# Patient Record
Sex: Female | Born: 1940 | Race: White | Hispanic: Yes | Marital: Married | Smoking: Never smoker
Health system: Southern US, Community
[De-identification: ages and names within clinical notes are randomized; demographics above are authoritative.]

## PROBLEM LIST (undated history)

## (undated) DIAGNOSIS — E119 Type 2 diabetes mellitus without complications: Secondary | ICD-10-CM

## (undated) DIAGNOSIS — I10 Essential (primary) hypertension: Secondary | ICD-10-CM

## (undated) HISTORY — PX: GALLBLADDER SURGERY: SHX652

## (undated) HISTORY — DX: Essential (primary) hypertension: I10

---

## 2004-04-08 ENCOUNTER — Ambulatory Visit (HOSPITAL_COMMUNITY): Admission: RE | Admit: 2004-04-08 | Discharge: 2004-04-09 | Payer: Self-pay | Admitting: Ophthalmology

## 2015-08-10 ENCOUNTER — Ambulatory Visit (INDEPENDENT_AMBULATORY_CARE_PROVIDER_SITE_OTHER): Payer: Medicare Other

## 2015-08-10 ENCOUNTER — Encounter (HOSPITAL_COMMUNITY): Payer: Self-pay | Admitting: *Deleted

## 2015-08-10 ENCOUNTER — Ambulatory Visit (HOSPITAL_COMMUNITY)
Admission: EM | Admit: 2015-08-10 | Discharge: 2015-08-10 | Disposition: A | Discharge status: Home / Self Care | Payer: Medicare Other | Attending: Emergency Medicine | Admitting: Emergency Medicine

## 2015-08-10 DIAGNOSIS — J40 Bronchitis, not specified as acute or chronic: Secondary | ICD-10-CM

## 2015-08-10 DIAGNOSIS — R05 Cough: Secondary | ICD-10-CM

## 2015-08-10 DIAGNOSIS — R059 Cough, unspecified: Secondary | ICD-10-CM

## 2015-08-10 DIAGNOSIS — R509 Fever, unspecified: Secondary | ICD-10-CM

## 2015-08-10 HISTORY — DX: Type 2 diabetes mellitus without complications: E11.9

## 2015-08-10 MED ORDER — PREDNISONE 5 MG PO TABS: ORAL_TABLET | ORAL | Status: DC

## 2015-08-10 MED ORDER — AZITHROMYCIN 250 MG PO TABS: 250.0000 mg | ORAL_TABLET | Freq: Every day | ORAL | Status: DC

## 2015-08-10 MED ORDER — HYDROCODONE-HOMATROPINE 5-1.5 MG/5ML PO SYRP: ORAL_SOLUTION | ORAL | Status: DC

## 2015-08-10 NOTE — Discharge Instructions (Signed)
You have bronchitis. Take all of your antibiotics as directed. Take the Prednisone as directed but keep a close eye on your sugar and dose insulin according to your schedule. The cough medication is for night time. You may take DELSYM during the day for cough as directed. Drink a lot of fluids and rest. Please f/u in the ED if you should worsen. Feel better.  Cough, Adult A cough helps to clear your throat and lungs. A cough may last only 2-3 weeks (acute), or it may last longer than 8 weeks (chronic). Many different things can cause a cough. A cough may be a sign of an illness or another medical condition. HOME CARE  Pay attention to any changes in your cough.  Take medicines only as told by your doctor.  If you were prescribed an antibiotic medicine, take it as told by your doctor. Do not stop taking it even if you start to feel better.  Talk with your doctor before you try using a cough medicine.  Drink enough fluid to keep your pee (urine) clear or pale yellow.  If the air is dry, use a cold steam vaporizer or humidifier in your home.  Stay away from things that make you cough at work or at home.  If your cough is worse at night, try using extra pillows to raise your head up higher while you sleep.  Do not smoke, and try not to be around smoke. If you need help quitting, ask your doctor.  Do not have caffeine.  Do not drink alcohol.  Rest as needed. GET HELP IF:  You have new problems (symptoms).  You cough up yellow fluid (pus).  Your cough does not get better after 2-3 weeks, or your cough gets worse.  Medicine does not help your cough and you are not sleeping well.  You have pain that gets worse or pain that is not helped with medicine.  You have a fever.  You are losing weight and you do not know why.  You have night sweats. GET HELP RIGHT AWAY IF:  You cough up blood.  You have trouble breathing.  Your heartbeat is very fast.   This information is not  intended to replace advice given to you by your health care provider. Make sure you discuss any questions you have with your health care provider.   Document Released: 12/26/2010 Document Revised: 01/03/2015 Document Reviewed: 06/21/2014 Elsevier Interactive Patient Education [employer]2016 Elsevier Inc.

## 2015-08-10 NOTE — ED Provider Notes (Signed)
CSN: [id number]     Arrival date & time 08/10/15  1334 History   First MD Initiated Contact with Patient 08/10/15 1616     Chief Complaint  Patient presents with  . Cough   (Consider location/radiation/quality/duration/timing/severity/associated sxs/prior Treatment) HPI Comments: Patient is a 75 yo Hispanic female who presents with a 5 day history of productive cough, chills, malaise and chest soreness. Subjective fevers. Reports of getting worse according to daughter who is present and interpreter.   The history is provided by the patient.    Past Medical History  Diagnosis Date  . Diabetes mellitus without complication (HCC)    History reviewed. No pertinent past surgical history. No family history on file. Social History  Substance Use Topics  . Smoking status: Never Smoker   . Smokeless tobacco: None  . Alcohol Use: Yes   OB History    No data available     Review of Systems  Constitutional: Positive for fever, chills and fatigue.  HENT: Negative.   Respiratory: Positive for cough. Negative for shortness of breath, wheezing and stridor.   Cardiovascular: Positive for chest pain.  Musculoskeletal: Negative.   Skin: Negative.   Neurological: Negative.     Allergies  Review of patient's allergies indicates no known allergies.  Home Medications   Prior to Admission medications   Medication Sig Start Date End Date Taking? Authorizing Provider  azithromycin (ZITHROMAX) 250 MG tablet Take 1 tablet (250 mg total) by mouth daily. Take first 2 tablets together, then 1 every day until finished. 08/10/15   Riki SheerMichelle G Young, PA-C  HYDROcodone-homatropine [facility](HYCODAN) 5-1.5 MG/5ML syrup 5ml at night for cough 08/10/15   Riki SheerMichelle G Young, PA-C  predniSONE (DELTASONE) 5 MG tablet 4 tabs po x 3 days, then 3 tabs po x 3 days, then 2 tabs po x 3 days then 1 tab po x 3 days then stop 08/10/15   Riki SheerMichelle G Young, PA-C   Meds Ordered and Administered this Visit  Medications - No data to  display  BP 122/76 mmHg  Pulse 88  Temp(Src) 99.8 F (37.7 C) (Oral)  Resp 16  SpO2 97% No data found.   Physical Exam  Constitutional: She is oriented to person, place, and time. She appears well-developed and well-nourished. No distress.  Neck: Normal range of motion.  Pulmonary/Chest: Effort normal.  Left basilar crackles, moving air without wheeze, no rales  Lymphadenopathy:    She has no cervical adenopathy.  Neurological: She is alert and oriented to person, place, and time.  Skin: Skin is warm and dry. No rash noted. She is not diaphoretic.  Psychiatric: Her behavior is normal.  Nursing note and vitals reviewed.   ED Course  Procedures (including critical care time)  Labs Review Labs Reviewed - No data to display  Imaging Review Dg Chest 2 View  08/10/2015  CLINICAL DATA:  Cough and low-grade fever for the past 5 days. EXAM: CHEST  2 VIEW COMPARISON:  04/08/2004. FINDINGS: Normal sized heart. Clear lungs with normal vascularity. Minimal central peribronchial thickening. Mild thoracic spine degenerative changes. IMPRESSION: Minimal bronchitic changes. Electronically Signed   By: Beckie SaltsSteven  Reid M.D.   On: 08/10/2015 17:14     Visual Acuity Review  Right Eye Distance:   Left Eye Distance:   Bilateral Distance:    Right Eye Near:   Left Eye Near:    Bilateral Near:         MDM   1. Bronchitis   2. Cough  3. Other specified fever    1. Due to age and co-morbidities cover with antibiotic. Treat with Prednisone and symptomatic care. Fluids and rest. If worsens please go to the ER for further evaluation.     Riki Sheer, PA-C 08/10/15 1731  Riki Sheer, PA-C 08/10/15 [phone]

## 2015-08-10 NOTE — ED Notes (Signed)
Pt  Has  An interpretor   At bedside

## 2015-08-10 NOTE — ED Notes (Signed)
Pt  Reports    Symptoms  Of          Cough   Congestion  With  Some  jitteryness       With  Onset  Of  Symptoms       For  Several days         Pt  Is  A  Diabetic        Took  Her  Insulin today  But  disd  Not  Check  Her   Sugar       She  Reports  The  Symptoms  Began  About 54-5  Days  Ago

## 2015-10-11 ENCOUNTER — Telehealth: Payer: Self-pay | Admitting: Cardiovascular Disease

## 2015-10-11 NOTE — Telephone Encounter (Signed)
[facility] 10/12/15 with Dr Allyson SabalBerry.  [facility] Dr Hazle CocaBerry's schedule on 10/12/15. lp

## 2015-10-12 ENCOUNTER — Other Ambulatory Visit: Payer: Self-pay

## 2015-10-12 ENCOUNTER — Ambulatory Visit (INDEPENDENT_AMBULATORY_CARE_PROVIDER_SITE_OTHER): Payer: Medicare Other | Admitting: Cardiovascular Disease

## 2015-10-12 ENCOUNTER — Encounter: Payer: Self-pay | Admitting: Cardiovascular Disease

## 2015-10-12 VITALS — BP 146/83 | HR 92 | Ht 62.0 in | Wt 148.6 lb

## 2015-10-12 DIAGNOSIS — R931 Abnormal findings on diagnostic imaging of heart and coronary circulation: Secondary | ICD-10-CM | POA: Diagnosis not present

## 2015-10-12 DIAGNOSIS — E785 Hyperlipidemia, unspecified: Secondary | ICD-10-CM | POA: Insufficient documentation

## 2015-10-12 DIAGNOSIS — I10 Essential (primary) hypertension: Secondary | ICD-10-CM | POA: Insufficient documentation

## 2015-10-12 DIAGNOSIS — R079 Chest pain, unspecified: Secondary | ICD-10-CM | POA: Diagnosis not present

## 2015-10-12 DIAGNOSIS — R9439 Abnormal result of other cardiovascular function study: Secondary | ICD-10-CM

## 2015-10-12 LAB — CBC WITH DIFFERENTIAL/PLATELET
Basophils Absolute: 66 cells/uL (ref 0–200)
Basophils Relative: 1 %
Eosinophils Absolute: 132 cells/uL (ref 15–500)
Eosinophils Relative: 2 %
HCT: 39.4 % (ref 35.0–45.0)
Hemoglobin: 12.7 g/dL (ref 11.7–15.5)
LYMPHS ABS: 1848 {cells}/uL (ref 850–3900)
Lymphocytes Relative: 28 %
MCH: 29.1 pg (ref 27.0–33.0)
MCHC: 32.2 g/dL (ref 32.0–36.0)
MCV: 90.2 fL (ref 80.0–100.0)
MPV: 11.1 fL (ref 7.5–12.5)
Monocytes Absolute: 660 cells/uL (ref 200–950)
Monocytes Relative: 10 %
Neutro Abs: 3894 cells/uL (ref 1500–7800)
Neutrophils Relative %: 59 %
Platelets: 322 10*3/uL (ref 140–400)
RBC: 4.37 MIL/uL (ref 3.80–5.10)
RDW: 12.3 % (ref 11.0–15.0)
WBC: 6.6 10*3/uL (ref 3.8–10.8)

## 2015-10-12 LAB — BASIC METABOLIC PANEL
BUN: 14 mg/dL (ref 7–25)
CO2: 27 mmol/L (ref 20–31)
Calcium: 8.5 mg/dL — ABNORMAL LOW (ref 8.6–10.4)
Chloride: 99 mmol/L (ref 98–110)
Creat: 0.76 mg/dL (ref 0.60–0.93)
Glucose, Bld: 430 mg/dL — ABNORMAL HIGH (ref 65–99)
Potassium: 5.1 mmol/L (ref 3.5–5.3)
SODIUM: 135 mmol/L (ref 135–146)

## 2015-10-12 LAB — TSH: TSH: 2.76 mIU/L

## 2015-10-12 NOTE — Progress Notes (Signed)
10/12/2015 Laurance FlattenYsidra Gubbels   1940-07-11  [id number]  Primary Physician No PCP Per Patient Primary Cardiologist: Runell GessJonathan J Reyan Helle MD Roseanne RenoFACP, FACC, FAHA, FSCAI  HPI:  Ms Sherry Ingram is a 75 year old mildly overweight married Latino female mother of 11 children referred by Dr. Hanley Haysosario for cardiac catheterization. She has a history of hypertension and insulin-dependent diabetes. She currently had a recent 2-D echo performed on June 6 that showed an EF of 35% with septal and anteroapical wall motion abnormality with an apical mural thrombus and subsequent pharmacologic Myoview stress test that showed severe apical mural scar. She was referred for cardiac catheterization to rule out significant CAD.   Current Outpatient Prescriptions  Medication Sig Dispense Refill  . azithromycin (ZITHROMAX) 250 MG tablet Take 1 tablet (250 mg total) by mouth daily. Take first 2 tablets together, then 1 every day until finished. 6 tablet 0  . HYDROcodone-homatropine (HYCODAN) 5-1.5 MG/5ML syrup 5ml at night for cough 120 mL 0  . predniSONE (DELTASONE) 5 MG tablet 4 tabs po x 3 days, then 3 tabs po x 3 days, then 2 tabs po x 3 days then 1 tab po x 3 days then stop 30 tablet 0   No current facility-administered medications for this visit.    No Known Allergies  Social History   Social History  . Marital Status: Married    Spouse Name: N/A  . Number of Children: N/A  . Years of Education: N/A   Occupational History  . Not on file.   Social History Main Topics  . Smoking status: Never Smoker   . Smokeless tobacco: Not on file  . Alcohol Use: Yes  . Drug Use: Not on file  . Sexual Activity: Not on file   Other Topics Concern  . Not on file   Social History Narrative     Review of Systems: General: negative for chills, fever, night sweats or weight changes.  Cardiovascular: negative for chest pain, dyspnea on exertion, edema, orthopnea, palpitations, paroxysmal nocturnal dyspnea or shortness of  breath Dermatological: negative for rash Respiratory: negative for cough or wheezing Urologic: negative for hematuria Abdominal: negative for nausea, vomiting, diarrhea, bright red blood per rectum, melena, or hematemesis Neurologic: negative for visual changes, syncope, or dizziness All other systems reviewed and are otherwise negative except as noted above.    Blood pressure 146/83, pulse 92, height 5\' 2"  (1.575 m), weight 148 lb 9.6 oz (67.405 kg).  General appearance: alert and no distress Neck: no adenopathy, no carotid bruit, no JVD, supple, symmetrical, trachea midline and thyroid not enlarged, symmetric, no tenderness/mass/nodules Lungs: clear to auscultation bilaterally Heart: regular rate and rhythm, S1, S2 normal, no murmur, click, rub or gallop Extremities: extremities normal, atraumatic, no cyanosis or edema  EKG normal sinus rhythm at 92 with Q waves in V1 through V4 and left axis deviation with low limb voltage. I personally reviewed this EKG  ASSESSMENT AND PLAN:   Abnormal nuclear stress test Ms Sherry Ingram it is a 75 year old mildly overweight married Latino female with very by Dr. Hanley Haysosario for an abnormal Myoview stress test for cardiac catheterization. 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 Dr. Hanley Haysosario wanted a left heart cath performed to rule out multivessel disease.  I have reviewed the risks, indications, and alternatives to cardiac catheterization, possible  angioplasty, and stenting with the patient. Risks include but are not limited to bleeding, infection, vascular injury, stroke, myocardial infection, arrhythmia, kidney injury, radiation-related injury in the case of prolonged fluoroscopy use, emergency cardiac surgery, and death. The patient understands the  risks of serious complication is 1-2 in 1000 with diagnostic cardiac cath and 1-2% or less with angioplasty/stenting.       Rishaan Gunner J. [place] Ndiaye MD FACP,FACC,FAHA, FSCAI 10/12/2015 11:23 AM 

## 2015-10-12 NOTE — Telephone Encounter (Signed)
This encounter was created in error - please disregard.

## 2015-10-12 NOTE — Assessment & Plan Note (Signed)
Ms Sherry Ingram it is a 75 year old mildly overweight married Latino female with very by Dr. Hanley Haysosario for an abnormal Myoview stress test for cardiac catheterization. 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 Dr. Hanley Haysosario wanted a left heart cath performed to rule out multivessel disease.  I have reviewed the risks, indications, and alternatives to cardiac catheterization, possible angioplasty, and stenting with the patient. Risks include but are not limited to bleeding, infection, vascular injury, stroke, myocardial infection, arrhythmia, kidney injury, radiation-related injury in the case of prolonged fluoroscopy use, emergency cardiac surgery, and death. The patient understands the risks of serious complication is 1-2 in 1000 with diagnostic cardiac cath and 1-2% or less with angioplasty/stenting.

## 2015-10-12 NOTE — Patient Instructions (Signed)
Medication Instructions:  Your physician recommends that you continue on your current medications as directed. Please refer to the Current Medication list given to you today.   Labwork: Your physician recommends that you return for lab work in: Today   Testing/Procedures: Your physician has requested that you have a cardiac catheterization. Cardiac catheterization is used to diagnose and/or treat various heart conditions. Doctors may recommend this procedure for a number of different reasons. The most common reason is to evaluate chest pain. Chest pain can be a symptom of coronary artery disease (CAD), and cardiac catheterization can show whether plaque is narrowing or blocking your heart's arteries. This procedure is also used to evaluate the valves, as well as measure the blood flow and oxygen levels in different parts of your heart. For further information please visit [email or web address]. Monday, 10/22/15  Following your catheterization, you will not be allowed to drive for 3 days.  No lifting, pushing, or pulling greater that 10 pounds is allowed for 1 week.  You will be required to have the following tests prior to the procedure:  1. Blood work-the blood work can be done no more than 7 days prior to the procedure.  [facility].  There is one downstairs on the first floor of this building and one in the [facility] building [phone]([address], [address]).   Puncture site Right Groin   Any Other Special Instructions Will Be Listed Below (If Applicable).     If you need a refill on your cardiac medications before your next appointment, please call your pharmacy.

## 2015-10-13 LAB — PROTIME-INR
INR: 1
Prothrombin Time: 10.8 s (ref 9.0–11.5)

## 2015-10-13 LAB — APTT: aPTT: 27 s (ref 22–34)

## 2015-10-19 ENCOUNTER — Telehealth: Payer: Self-pay | Admitting: *Deleted

## 2015-10-19 NOTE — Telephone Encounter (Signed)
Smokey from the cath lab spoke with patient regarding change in arrival time and procedure

## 2015-10-22 ENCOUNTER — Ambulatory Visit (HOSPITAL_COMMUNITY)
Admission: RE | Admit: 2015-10-22 | Discharge: 2015-10-22 | Disposition: A | Discharge status: Home / Self Care | Payer: Medicare Other | Source: Ambulatory Visit | Attending: Cardiovascular Disease | Admitting: Cardiovascular Disease

## 2015-10-22 ENCOUNTER — Encounter (HOSPITAL_COMMUNITY)
Admission: RE | Disposition: A | Discharge status: Home / Self Care | Payer: Self-pay | Source: Ambulatory Visit | Attending: Cardiovascular Disease

## 2015-10-22 DIAGNOSIS — Z794 Long term (current) use of insulin: Secondary | ICD-10-CM | POA: Diagnosis not present

## 2015-10-22 DIAGNOSIS — E119 Type 2 diabetes mellitus without complications: Secondary | ICD-10-CM | POA: Insufficient documentation

## 2015-10-22 DIAGNOSIS — I10 Essential (primary) hypertension: Secondary | ICD-10-CM | POA: Diagnosis not present

## 2015-10-22 DIAGNOSIS — Z6828 Body mass index (BMI) 28.0-28.9, adult: Secondary | ICD-10-CM | POA: Diagnosis not present

## 2015-10-22 DIAGNOSIS — R9439 Abnormal result of other cardiovascular function study: Secondary | ICD-10-CM | POA: Diagnosis present

## 2015-10-22 DIAGNOSIS — I2510 Atherosclerotic heart disease of native coronary artery without angina pectoris: Secondary | ICD-10-CM

## 2015-10-22 DIAGNOSIS — E663 Overweight: Secondary | ICD-10-CM | POA: Diagnosis not present

## 2015-10-22 HISTORY — PX: CARDIAC CATHETERIZATION: SHX172

## 2015-10-22 SURGERY — LEFT HEART CATH AND CORONARY ANGIOGRAPHY
Anesthesia: LOCAL

## 2015-10-22 MED ORDER — MORPHINE SULFATE (PF) 2 MG/ML IV SOLN: 2.0000 mg | INTRAVENOUS | Status: DC | PRN

## 2015-10-22 MED ORDER — SODIUM CHLORIDE 0.9 % WEIGHT BASED INFUSION: 1.0000 mL/kg/h | INTRAVENOUS | Status: DC

## 2015-10-22 MED ORDER — SODIUM CHLORIDE 0.9 % IV SOLN: 250.0000 mL | INTRAVENOUS | Status: DC | PRN

## 2015-10-22 MED ORDER — SODIUM CHLORIDE 0.9% FLUSH: 3.0000 mL | INTRAVENOUS | Status: DC | PRN

## 2015-10-22 MED ORDER — ACETAMINOPHEN 325 MG PO TABS: 650.0000 mg | ORAL_TABLET | ORAL | Status: DC | PRN

## 2015-10-22 MED ORDER — SODIUM CHLORIDE 0.9% FLUSH: 3.0000 mL | Freq: Two times a day (BID) | INTRAVENOUS | Status: DC

## 2015-10-22 MED ORDER — ASPIRIN 81 MG PO CHEW
81.0000 mg | CHEWABLE_TABLET | ORAL | Status: AC
  Administered 2015-10-22: 81 mg via ORAL

## 2015-10-22 MED ORDER — HEPARIN (PORCINE) IN NACL 2-0.9 UNIT/ML-% IJ SOLN
INTRAMUSCULAR | Status: DC | PRN
  Administered 2015-10-22: 1000 mL

## 2015-10-22 MED ORDER — LIDOCAINE HCL (PF) 1 % IJ SOLN
INTRAMUSCULAR | Status: DC | PRN
  Administered 2015-10-22: 25 mL

## 2015-10-22 MED ORDER — IOPAMIDOL (ISOVUE-370) INJECTION 76%
INTRAVENOUS | Status: DC | PRN
  Administered 2015-10-22: 50 mL via INTRA_ARTERIAL

## 2015-10-22 MED ORDER — ONDANSETRON HCL 4 MG/2ML IJ SOLN: 4.0000 mg | Freq: Four times a day (QID) | INTRAMUSCULAR | Status: DC | PRN

## 2015-10-22 MED ORDER — LIDOCAINE HCL (PF) 1 % IJ SOLN
INTRAMUSCULAR | Status: AC
  Filled 2015-10-22: qty 30

## 2015-10-22 MED ORDER — ASPIRIN 81 MG PO CHEW
CHEWABLE_TABLET | ORAL | Status: AC
  Filled 2015-10-22: qty 1

## 2015-10-22 MED ORDER — HEPARIN (PORCINE) IN NACL 2-0.9 UNIT/ML-% IJ SOLN
INTRAMUSCULAR | Status: AC
  Filled 2015-10-22: qty 1000

## 2015-10-22 MED ORDER — IOPAMIDOL (ISOVUE-370) INJECTION 76%
INTRAVENOUS | Status: AC
  Filled 2015-10-22: qty 100

## 2015-10-22 MED ORDER — SODIUM CHLORIDE 0.9 % WEIGHT BASED INFUSION
3.0000 mL/kg/h | INTRAVENOUS | Status: DC
  Administered 2015-10-22: 3 mL/kg/h via INTRAVENOUS

## 2015-10-22 SURGICAL SUPPLY — 7 items
CATH INFINITI 5FR MULTPACK ANG (CATHETERS) ×1 IMPLANT
DEVICE CLOSURE MYNXGRIP 5F (Vascular Products) ×1 IMPLANT
KIT HEART LEFT (KITS) ×2 IMPLANT
PACK CARDIAC CATHETERIZATION (CUSTOM PROCEDURE TRAY) ×2 IMPLANT
SHEATH PINNACLE 5F 10CM (SHEATH) ×1 IMPLANT
TRANSDUCER W/STOPCOCK (MISCELLANEOUS) ×2 IMPLANT
WIRE EMERALD 3MM-J .035X150CM (WIRE) ×1 IMPLANT

## 2015-10-22 NOTE — Interval H&P Note (Signed)
Cath Lab Visit (complete for each Cath Lab visit)  Clinical Evaluation Leading to the Procedure:   ACS: No.  Non-ACS:    Anginal Classification: CCS I  Anti-ischemic medical therapy: No Therapy  Non-Invasive Test Results: Intermediate-risk stress test findings: cardiac mortality 1-3%/year  Prior CABG: No previous CABG      History and Physical Interval Note:  10/22/2015 2:31 PM  Sherry Ingram  has presented today for surgery, with the diagnosis of Abnromal Stress Test/CP  The various methods of treatment have been discussed with the patient and family. After consideration of risks, benefits and other options for treatment, the patient has consented to  Procedure(s): Left Heart Cath and Coronary Angiography (N/A) as a surgical intervention .  The patient's history has been reviewed, patient examined, no change in status, stable for surgery.  I have reviewed the patient's chart and labs.  Questions were answered to the patient's satisfaction.     Nanetta BattyBerry, Jonathan

## 2015-10-22 NOTE — H&P (View-Only) (Signed)
10/12/2015 Sherry Ingram   1940-07-11  [id number]  Primary Physician No PCP Per Patient Primary Cardiologist: Runell GessJonathan J Makaela Cando MD Roseanne RenoFACP, FACC, FAHA, FSCAI  HPI:  Sherry Ingram is a 75 year old mildly overweight married Latino female mother of 11 children referred by Dr. Hanley Haysosario for cardiac catheterization. She has a history of hypertension and insulin-dependent diabetes. She currently had a recent 2-D echo performed on June 6 that showed an EF of 35% with septal and anteroapical wall motion abnormality with an apical mural thrombus and subsequent pharmacologic Myoview stress test that showed severe apical mural scar. She was referred for cardiac catheterization to rule out significant CAD.   Current Outpatient Prescriptions  Medication Sig Dispense Refill  . azithromycin (ZITHROMAX) 250 MG tablet Take 1 tablet (250 mg total) by mouth daily. Take first 2 tablets together, then 1 every day until finished. 6 tablet 0  . HYDROcodone-homatropine (HYCODAN) 5-1.5 MG/5ML syrup 5ml at night for cough 120 mL 0  . predniSONE (DELTASONE) 5 MG tablet 4 tabs po x 3 days, then 3 tabs po x 3 days, then 2 tabs po x 3 days then 1 tab po x 3 days then stop 30 tablet 0   No current facility-administered medications for this visit.    No Known Allergies  Social History   Social History  . Marital Status: Married    Spouse Name: N/A  . Number of Children: N/A  . Years of Education: N/A   Occupational History  . Not on file.   Social History Main Topics  . Smoking status: Never Smoker   . Smokeless tobacco: Not on file  . Alcohol Use: Yes  . Drug Use: Not on file  . Sexual Activity: Not on file   Other Topics Concern  . Not on file   Social History Narrative     Review of Systems: General: negative for chills, fever, night sweats or weight changes.  Cardiovascular: negative for chest pain, dyspnea on exertion, edema, orthopnea, palpitations, paroxysmal nocturnal dyspnea or shortness of  breath Dermatological: negative for rash Respiratory: negative for cough or wheezing Urologic: negative for hematuria Abdominal: negative for nausea, vomiting, diarrhea, bright red blood per rectum, melena, or hematemesis Neurologic: negative for visual changes, syncope, or dizziness All other systems reviewed and are otherwise negative except as noted above.    Blood pressure 146/83, pulse 92, height 5\' 2"  (1.575 m), weight 148 lb 9.6 oz (67.405 kg).  General appearance: alert and no distress Neck: no adenopathy, no carotid bruit, no JVD, supple, symmetrical, trachea midline and thyroid not enlarged, symmetric, no tenderness/mass/nodules Lungs: clear to auscultation bilaterally Heart: regular rate and rhythm, S1, S2 normal, no murmur, click, rub or gallop Extremities: extremities normal, atraumatic, no cyanosis or edema  EKG normal sinus rhythm at 92 with Q waves in V1 through V4 and left axis deviation with low limb voltage. I personally reviewed this EKG  ASSESSMENT AND PLAN:   Abnormal nuclear stress test Sherry Ingram it is a 75 year old mildly overweight married Latino female with very by Dr. Hanley Haysosario for an abnormal Myoview stress test for cardiac catheterization. 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 Dr. Hanley Haysosario wanted a left heart cath performed to rule out multivessel disease.  I have reviewed the risks, indications, and alternatives to cardiac catheterization, possible  angioplasty, and stenting with the patient. Risks include but are not limited to bleeding, infection, vascular injury, stroke, myocardial infection, arrhythmia, kidney injury, radiation-related injury in the case of prolonged fluoroscopy use, emergency cardiac surgery, and death. The patient understands the  risks of serious complication is 1-2 in 1000 with diagnostic cardiac cath and 1-2% or less with angioplasty/stenting.       Runell GessJonathan J. Vlad Mayberry MD FACP,FACC,FAHA, [facility] 10/12/2015 11:23 AM

## 2015-10-22 NOTE — Discharge Instructions (Signed)

## 2015-10-23 ENCOUNTER — Encounter (HOSPITAL_COMMUNITY): Payer: Self-pay | Admitting: Cardiovascular Disease

## 2015-10-24 ENCOUNTER — Telehealth: Payer: Self-pay | Admitting: Cardiovascular Disease

## 2015-10-24 NOTE — Telephone Encounter (Signed)
Daughter is here from [place] taking care of pt while she is sick.The daughter will need a note for work. Please call,she will give you the details on what she needs.Please call asap,she needs this note to stay longer. She was supposed to leave today.

## 2015-10-24 NOTE — Telephone Encounter (Signed)
Returned call to patient's daughter Byrd HesselbachMaria.She stated she came to [place] to help take care of her mother who recently had a cardiac cath.Stated she needed a letter to be out of work 10/19/15 to 11/02/15.Letter faxed to [employer] at fax # [phone].

## 2015-11-21 ENCOUNTER — Ambulatory Visit: Payer: Medicare Other | Admitting: Nurse Practitioner

## 2015-12-14 ENCOUNTER — Encounter: Payer: Self-pay | Admitting: *Deleted

## 2015-12-14 ENCOUNTER — Ambulatory Visit: Payer: Medicare Other | Admitting: Cardiovascular Disease

## 2015-12-14 NOTE — Progress Notes (Signed)
Letter mailed

## 2017-06-18 ENCOUNTER — Encounter: Payer: Self-pay | Admitting: Family Medicine

## 2017-12-19 IMAGING — DX DG CHEST 2V
2 series · 2 of 2 positions shown · non-contrast
Comparison: 04/08/2004.

CLINICAL DATA: Cough and low-grade fever for the past 5 days.

EXAM:
CHEST  2 VIEW

[chest pa]
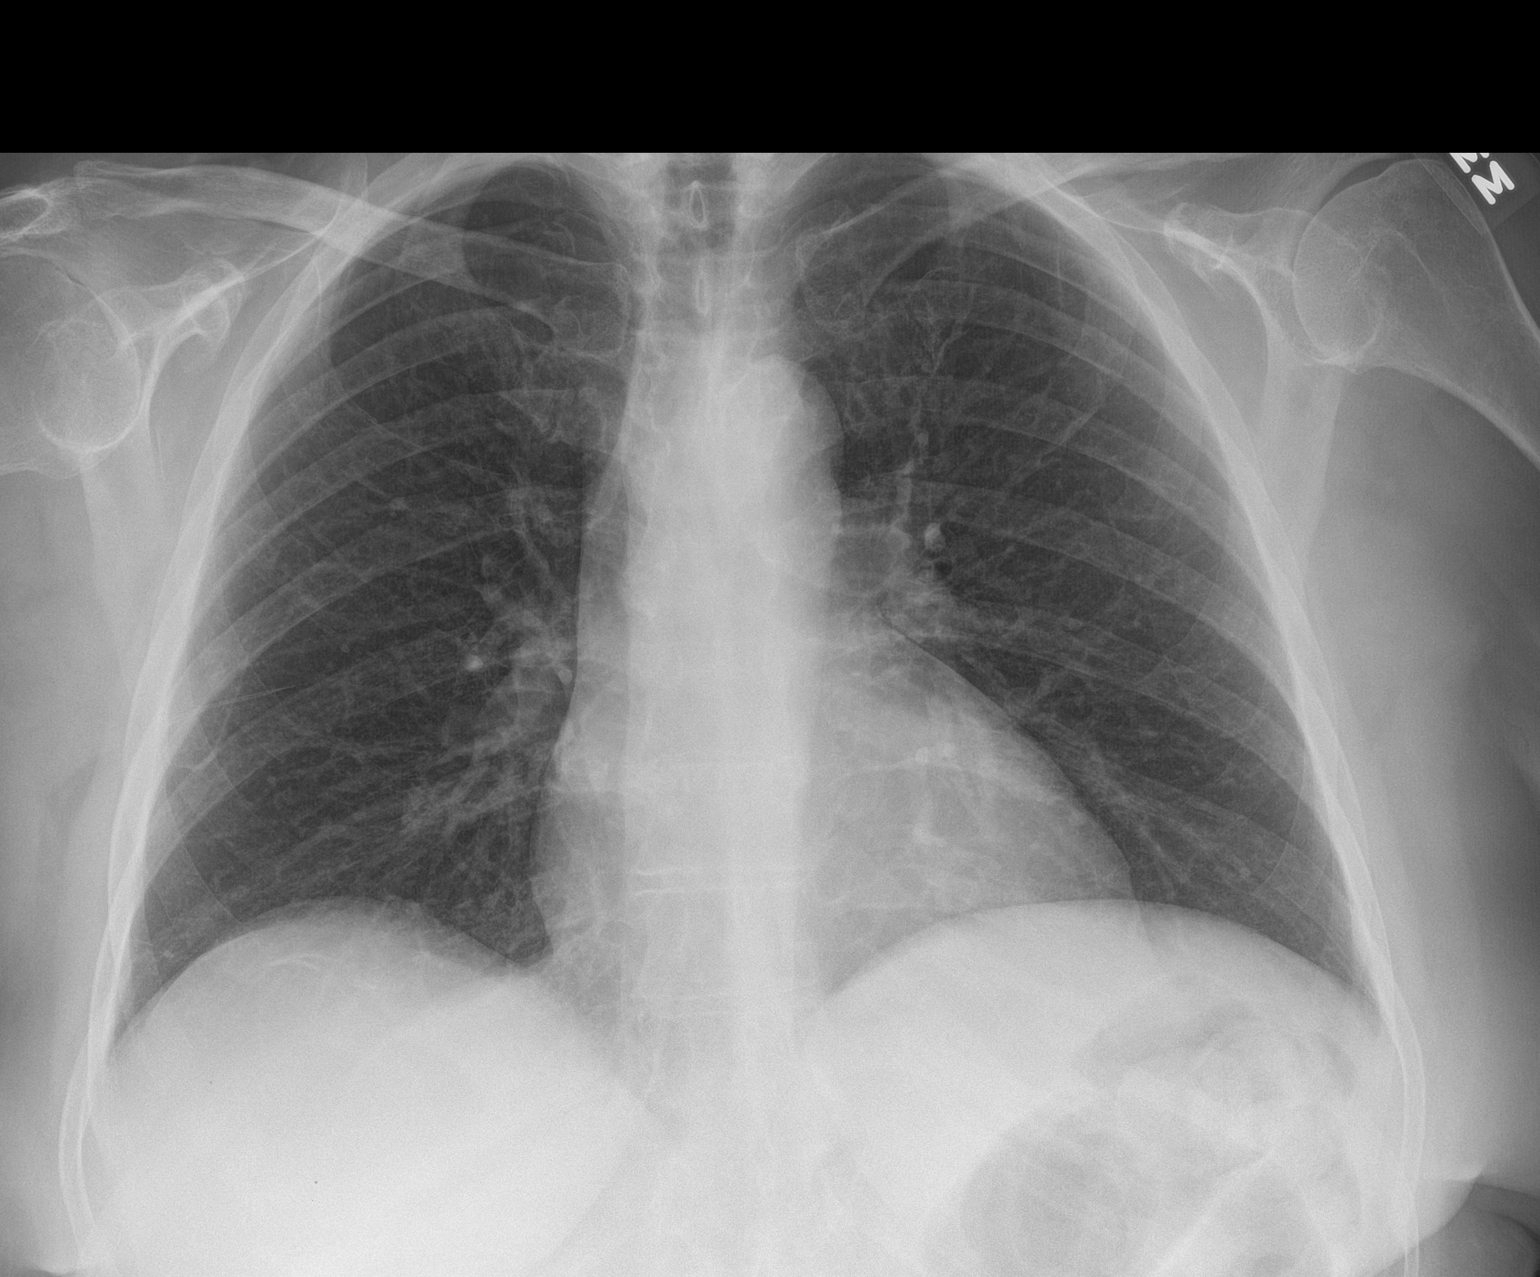

[chest lat]
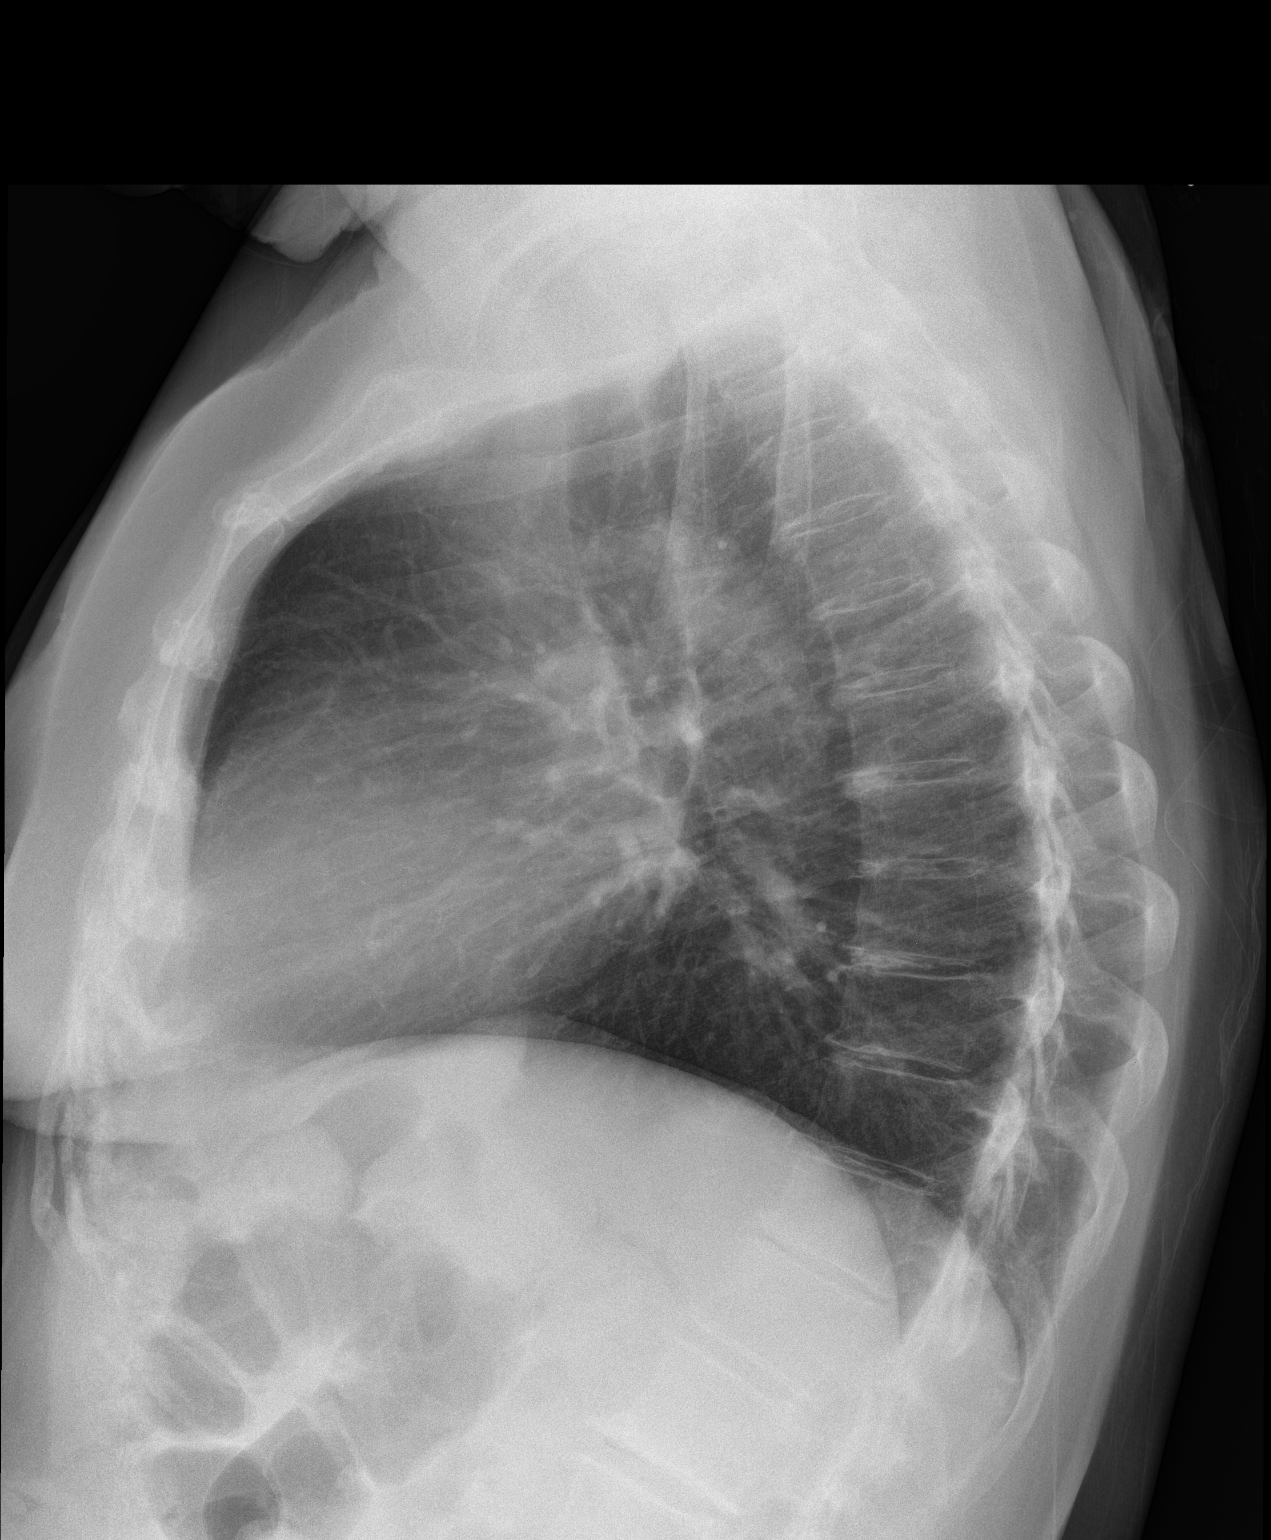

[2 of 2 positions shown; findings below may reference images not displayed]

FINDINGS: Normal sized heart. Clear lungs with normal vascularity. Minimal
central peribronchial thickening. Mild thoracic spine degenerative
changes.
IMPRESSION: Minimal bronchitic changes.

## 2018-03-03 ENCOUNTER — Ambulatory Visit: Payer: Medicare Other | Admitting: Obstetrics and Gynecology

## 2018-03-03 ENCOUNTER — Encounter: Payer: Self-pay | Admitting: *Deleted

## 2018-03-03 ENCOUNTER — Encounter: Payer: Self-pay | Admitting: Obstetrics and Gynecology

## 2018-03-03 DIAGNOSIS — R339 Retention of urine, unspecified: Secondary | ICD-10-CM

## 2018-03-03 DIAGNOSIS — R319 Hematuria, unspecified: Secondary | ICD-10-CM | POA: Insufficient documentation

## 2018-03-03 NOTE — Progress Notes (Signed)
Pt has been having light bleeding  For 4 months straight then it stopped for a week and restarted and it was heavier. She went to the hospital for the bleeding and gave her an injection and it stopped for awhile but it restarted.She hasn't been bleeding today, is having dizziness & eyesight is getting worse.

## 2018-03-04 NOTE — Progress Notes (Signed)
Patient ID: Sherry Ingram, female   DOB: June 10, 1940, 77 y.o.   MRN: [id number] Pt presents with husband with c/o hematuria and pain. This has been a problems for several months. Review of medical records that pt brought, reveal that she has been seen by private OB/GYN for this problem. She has had a GYN U/S and pap smear. She has also been seen in ER for the same problem. She has had a CT scan and has been referred to urology for further evaluation.  Pt and husband were informed that she needs to keep her appt with the urology has scheduled for further evaluation of her problem.   PE AF VSS Lungs  Heart RRR  A/P Hematuria  As noted above. F/U PRN Interrupter was used during this office.

## 2019-06-16 ENCOUNTER — Encounter: Payer: Self-pay | Admitting: Physician Assistant

## 2020-03-02 ENCOUNTER — Encounter: Payer: Self-pay | Admitting: Cardiovascular Disease

## 2020-03-02 ENCOUNTER — Ambulatory Visit (INDEPENDENT_AMBULATORY_CARE_PROVIDER_SITE_OTHER): Payer: Medicare Other | Admitting: Cardiovascular Disease

## 2020-03-02 ENCOUNTER — Other Ambulatory Visit: Payer: Self-pay

## 2020-03-02 VITALS — BP 108/68 | HR 69 | Ht 60.0 in | Wt 142.0 lb

## 2020-03-02 DIAGNOSIS — I10 Essential (primary) hypertension: Secondary | ICD-10-CM | POA: Diagnosis not present

## 2020-03-02 DIAGNOSIS — E785 Hyperlipidemia, unspecified: Secondary | ICD-10-CM | POA: Diagnosis not present

## 2020-03-02 DIAGNOSIS — I2510 Atherosclerotic heart disease of native coronary artery without angina pectoris: Secondary | ICD-10-CM | POA: Diagnosis not present

## 2020-03-02 NOTE — Assessment & Plan Note (Signed)
History of essential hypertension a blood pressure measured today 108/68.  She is on carvedilol, losartan and hydrochlorothiazide.

## 2020-03-02 NOTE — Patient Instructions (Signed)
Medication Instructions:  Your physician recommends that you continue on your current medications as directed. Please refer to the Current Medication list given to you today.  *If you need a refill on your cardiac medications before your next appointment, please call your pharmacy*  Follow-Up: At [facility], you and your health needs are our priority.  As part of our continuing mission to provide you with exceptional heart care, we have created designated Provider Care Teams.  These Care Teams include your primary Cardiologist (physician) and Advanced Practice Providers (APPs -  Physician Assistants and Nurse Practitioners) who all work together to provide you with the care you need, when you need it.  We recommend signing up for the patient portal called "MyChart".  Sign up information is provided on this After Visit Summary.  MyChart is used to connect with patients for Virtual Visits (Telemedicine).  Patients are able to view lab/test results, encounter notes, upcoming appointments, etc.  Non-urgent messages can be sent to your provider as well.   To learn more about what you can do with MyChart, go to [email or web address].    Your next appointment:    You may see Dr. Erlene Quan when needed.

## 2020-03-02 NOTE — Assessment & Plan Note (Signed)
History of CAD with moderate LV dysfunction and anteroapical scar on Myoview stress test performed 06/18/2017.  I did perform cardiac catheterization on her femoral E6/26/17 revealing a recanalized LAD in the midportion after second diagonal branch with TIMI-3 flow.  I did not think this required intervention given the fact that it did not seem she had viable myocardium beyond this and she has been asymptomatic otherwise except for some mild chest pain in the last several weeks at which has resolved spontaneously.

## 2020-03-02 NOTE — Progress Notes (Signed)
03/02/2020 Sherry Ingram   04-16-1941  [id number]  Primary Physician Patient, No Pcp Per Primary Cardiologist: Runell Gess MD Sherry Ingram, [place]  HPI:  Sherry Ingram is a 79 y.o.  mildly overweight married Latino female mother of 11 children referred by Dr. Hanley Hays for cardiac catheterization. She has a history of hypertension and insulin-dependent diabetes. She currently had a recent 2-D echo performed on June 6 that showed an EF of 35% with septal and anteroapical wall motion abnormality with an apical mural thrombus and subsequent pharmacologic Myoview stress test that showed severe apical mural scar. She was referred for cardiac catheterization to rule out significant CAD.  I performed left heart cath via the right femoral approach 10/22/2015 revealing a recanalized LAD in the midportion of the second diagonal branch.  She had no other significant CAD.  Based on the fact that her Myoview showed anteroapical scar (nonviable myocardium) I elected not to intervene.  She returns today to "check out her heart".  She did have some chest pain several weeks ago but none since and is otherwise been stable.    Current Meds  Medication Sig  . aspirin EC 81 MG tablet Take 81 mg by mouth daily.  . carvedilol (COREG) 3.125 MG tablet TOME UNA TABLETA DOS VECES AL D?A  . ENTRESTO 49-51 MG TOME UNA TABLETA DOS VECES AL D?A  . insulin glargine (LANTUS) 100 UNIT/ML injection Inject 30 Units into the skin every morning.  Marland Kitchen JANUVIA 100 MG tablet TOME UNA TABLETA TODOS LOS D?AS  . losartan-hydrochlorothiazide (HYZAAR) 50-12.5 MG tablet TOME UNA TABLETA TODOS LOS D?AS  . metFORMIN (GLUCOPHAGE) 1000 MG tablet Take 1,000 mg by mouth 2 (two) times daily with a meal.  . pravastatin (PRAVACHOL) 20 MG tablet Take 20 mg by mouth daily.     No Known Allergies  Social History   Socioeconomic History  . Marital status: Married    Spouse name: Not on file  . Number of  children: Not on file  . Years of education: Not on file  . Highest education level: Not on file  Occupational History  . Not on file  Tobacco Use  . Smoking status: Never Smoker  . Smokeless tobacco: Never Used  Vaping Use  . Vaping Use: Never used  Substance and Sexual Activity  . Alcohol use: Yes  . Drug use: Never  . Sexual activity: Not on file  Other Topics Concern  . Not on file  Social History Narrative  . Not on file   Social Determinants of Health   Financial Resource Strain:   . Difficulty of Paying Living Expenses: Not on file  Food Insecurity:   . Worried About Programme researcher, broadcasting/film/video in the Last Year: Not on file  . Ran Out of Food in the Last Year: Not on file  Transportation Needs:   . Lack of Transportation (Medical): Not on file  . Lack of Transportation (Non-Medical): Not on file  Physical Activity:   . Days of Exercise per Week: Not on file  . Minutes of Exercise per Session: Not on file  Stress:   . Feeling of Stress : Not on file  Social Connections:   . Frequency of Communication with Friends and Family: Not on file  . Frequency of Social Gatherings with Friends and Family: Not on file  . Attends Religious Services: Not on file  . Active Member of Clubs or Organizations: Not on file  . Attends  Club or Organization Meetings: Not on file  . Marital Status: Not on file  Intimate Partner Violence:   . Fear of Current or Ex-Partner: Not on file  . Emotionally Abused: Not on file  . Physically Abused: Not on file  . Sexually Abused: Not on file     Review of Systems: General: negative for chills, fever, night sweats or weight changes.  Cardiovascular: negative for chest pain, dyspnea on exertion, edema, orthopnea, palpitations, paroxysmal nocturnal dyspnea or shortness of breath Dermatological: negative for rash Respiratory: negative for cough or wheezing Urologic: negative for hematuria Abdominal: negative for nausea, vomiting, diarrhea, bright red  blood per rectum, melena, or hematemesis Neurologic: negative for visual changes, syncope, or dizziness All other systems reviewed and are otherwise negative except as noted above.    Blood pressure 108/68, pulse 69, height 5' (1.524 m), weight 142 lb (64.4 kg), SpO2 99 %.  General appearance: alert and no distress Neck: no adenopathy, no carotid bruit, no JVD, supple, symmetrical, trachea midline and thyroid not enlarged, symmetric, no tenderness/mass/nodules Lungs: clear to auscultation bilaterally Heart: regular rate and rhythm, S1, S2 normal, no murmur, click, rub or gallop Extremities: extremities normal, atraumatic, no cyanosis or edema Pulses: 2+ and symmetric Skin: Skin color, texture, turgor normal. No rashes or lesions Neurologic: Alert and oriented X 3, normal strength and tone. Normal symmetric reflexes. Normal coordination and gait  EKG sinus rhythm at 69 with septal Q waves and lateral T wave inversion.  I personally reviewed this EKG.  ASSESSMENT AND PLAN:   Essential hypertension History of essential hypertension a blood pressure measured today 108/68.  She is on carvedilol, losartan and hydrochlorothiazide.  Hyperlipidemia History of hyperlipidemia on statin therapy with lipid profile performed by her PCP revealing a total cholesterol 170, LDL of 79 and HDL 42.  Coronary artery disease History of CAD with moderate LV dysfunction and anteroapical scar on Myoview stress test performed 06/18/2017.  I did perform cardiac catheterization on her femoral E6/26/17 revealing a recanalized LAD in the midportion after second diagonal branch with TIMI-3 flow.  I did not think this required intervention given the fact that it did not seem she had viable myocardium beyond this and she has been asymptomatic otherwise except for some mild chest pain in the last several weeks at which has resolved spontaneously.      Runell Gess MD FACP,FACC,FAHA, [facility] 03/02/2020 11:30 AM

## 2020-03-02 NOTE — Assessment & Plan Note (Signed)
History of hyperlipidemia on statin therapy with lipid profile performed by her PCP revealing a total cholesterol 170, LDL of 79 and HDL 42.

## 2020-05-15 ENCOUNTER — Ambulatory Visit: Payer: Self-pay | Admitting: Podiatry
